# Patient Record
Sex: Male | Born: 1965
Health system: Southern US, Community
[De-identification: ages and names within clinical notes are randomized; demographics above are authoritative.]

## PROBLEM LIST (undated history)

## (undated) DIAGNOSIS — N189 Chronic kidney disease, unspecified: Secondary | ICD-10-CM

---

## 2023-07-06 DIAGNOSIS — N1832 Chronic kidney disease, stage 3b: Secondary | ICD-10-CM | POA: Insufficient documentation

## 2023-07-06 DIAGNOSIS — R410 Disorientation, unspecified: Principal | ICD-10-CM

## 2023-07-06 DIAGNOSIS — N179 Acute kidney failure, unspecified: Secondary | ICD-10-CM | POA: Insufficient documentation

## 2023-07-06 DIAGNOSIS — R030 Elevated blood-pressure reading, without diagnosis of hypertension: Secondary | ICD-10-CM | POA: Insufficient documentation

## 2023-07-06 DIAGNOSIS — G9340 Encephalopathy, unspecified: Secondary | ICD-10-CM

## 2023-07-06 DIAGNOSIS — R451 Restlessness and agitation: Secondary | ICD-10-CM | POA: Insufficient documentation

## 2023-07-06 DIAGNOSIS — F1910 Other psychoactive substance abuse, uncomplicated: Secondary | ICD-10-CM | POA: Insufficient documentation

## 2023-07-06 NOTE — ED Notes (Addendum)
 Pt alert and roaming halls threatening to leave. Pt in distress expressing that he can see his deceased family members and proceeds to follow them down the hallway. Unable to be redirected by RN and NT. MD able to redirect him to his assigned room. Pt requested android charger. NT searching for one but hasn't been able to find one yet.

## 2023-07-06 NOTE — ED Notes (Signed)
 MD notified of patient's change in status since discharge earlier today

## 2023-07-06 NOTE — ED Triage Notes (Addendum)
 Pt BIB mother with c/o AMS that started today. Pt seen earlier today for CP. Pt very lethargic in triage. Pt arousable to verbal stimuli at times. Pt denies taking any medication. Pt c/o CP. Pt does have dried blood in his left ear.

## 2023-07-06 NOTE — ED Notes (Signed)
 Patient endorses visual hallucinations

## 2023-07-06 NOTE — ED Notes (Signed)
RN attempted IV insertion x2.  

## 2023-07-07 DIAGNOSIS — N179 Acute kidney failure, unspecified: Secondary | ICD-10-CM | POA: Insufficient documentation

## 2023-07-07 DIAGNOSIS — G9340 Encephalopathy, unspecified: Secondary | ICD-10-CM

## 2023-07-07 DIAGNOSIS — F1910 Other psychoactive substance abuse, uncomplicated: Secondary | ICD-10-CM | POA: Insufficient documentation

## 2023-07-07 MED ORDER — HYDROXYZINE HCL 25 MG PO TABS
25.0000 mg | ORAL_TABLET | Freq: Three times a day (TID) | ORAL | Status: DC | PRN
Start: 2023-07-07 — End: 2023-07-08

## 2023-07-07 NOTE — Progress Notes (Signed)
 Pt laying in bed, crying and shouting. Checked on pt and he says he has a headache. Asked if pt wanted tylenol  or ibuprofen. Pt states "why would you ask anyone is they want tylenol ? I didn't even ask for your help." Informed pt that he was screaming so had to check up on pt. Pt refused tylenol  and states "I don't take tylenol  at home, I've had liver failure twice." Asked pt what he does at home to help with headaches. Pt states " I don't get headaches like this at home. It's because I'm here in the hospital. I don't need your help" Informed pt this RN will step out if pt did not need assist at this time. Sitter still at bedside.

## 2023-07-07 NOTE — ED Notes (Signed)
Pt refusing cardiac monitoring at this time.

## 2023-07-07 NOTE — ED Notes (Signed)
 IVC:  ENVELOPE: [id number] Case # [id number]  3 COPIES PLACED ON CLIPBOARD 1 COPY PLACED IN MEDICAL RECORDS ORIGINAL IN RED FOLDER

## 2023-07-07 NOTE — ED Notes (Addendum)
 Hospital police called as pt stated he wanted to get out of room and walk. Informed pt he can walk in his room. Pt started raising voice and stated he was not going to walk in his room and wanted to go outside. Informed pt that he was IVC'd under MD and he would not be able to do that. Pt stated he will then just take off all tele cords and walk himself. Informed pt hospital police will be called. Pt starting to take off all tele cords during hospital police arrival. Pt's belongings taken from pt and educated on reasoning for holding belongings during IVC process.

## 2023-07-07 NOTE — ED Notes (Signed)
 IVC'd 07/06/23, exp 07/13/23; IVC docs taken to [facility]

## 2023-07-07 NOTE — Progress Notes (Signed)
 Per MD at bedside, ok for IVC pt to have cellphone with patient.

## 2023-07-07 NOTE — Progress Notes (Signed)
 Hospital police at bedside.

## 2023-07-07 NOTE — Progress Notes (Signed)
 MD at bedside.

## 2023-07-07 NOTE — Progress Notes (Signed)
 Pt calm and cooperative upon admission to floor. Sitter at bedside. VSS on RA.  Placed on telemetry per order.  Lethargic and falling over while on EOB. UTA admission questions.  Per report, jewelry and phone are locked with hospital police.  Other personal belongings including backpack, clothing, keys, and charger box are in the cabinet outside of his room.

## 2023-07-07 NOTE — H&P (Signed)
 History and Physical    Francis Brady. [id number] DOB: 30-Mar-1965 DOA: 07/06/2023  Patient coming from: Home.  Chief Complaint: Confusion.  HPI: Francis Brady. is a 58 y.o. male with history of polysubstance abuse, chronic kidney disease stage III baseline creatinine around 2 was brought to the ER after patient was found to be increasingly confused.  Patient had come to the ER earlier for chest pain and was observed and discharged home.  Later patient was found to be confused and was brought to the ER.  Patient is not able to provide much history because of his confusion.  I tried to reach patient's mother on the contact list but I was unable to reach.  ED Course: In the ER patient was getting agitated was given Haldol  and Ativan  orally following which patient became  more calm.  CT head is unremarkable.  Labs show mildly worsening creatinine from baseline of around 2 it is around 2.5 now.  Potassium is 5.3.  Ammonia is 31.  Blood glucose was 68 but improved to 95.  Acetaminophen  level and salicylate levels were negative.  Patient admitted for acute encephalopathy because not clear but suspect could be polysubstance.  Review of Systems: As per HPI, rest all negative.   Past Medical History:  Diagnosis Date   Chronic kidney disease     History reviewed. No pertinent surgical history.   reports that he has quit smoking. His smoking use included cigarettes. He has never used smokeless tobacco. He reports that he does not currently use alcohol. No history on file for drug use.  Allergies  Allergen Reactions   Nsaids Anaphylaxis   Acetaminophen      Other Reaction(s): liver damage   Aspirin     Other Reaction(s): hemolytic anemia   Ciprofloxacin     Other Reaction(s): Unknown   Duloxetine     Other Reaction(s): mood changes/suicidal ideas   Eszopiclone     Other Reaction(s): CNS problems   Influenza Vaccines     Other Reaction(s): WBC bottomed out   Latex Dermatitis    Morphine     Other Reaction(s): CNS problems   Pneumococcal Vaccines     Other Reaction(s): WBC bottomed out   Pregabalin Other (See Comments)   Prochlorperazine Edisylate Nausea And Vomiting   Warfarin     Other Reaction(s): Unknown    Family History  Family history unknown: Yes    Prior to Admission medications   Medication Sig Start Date End Date Taking? Authorizing Provider  famotidine (PEPCID) 20 MG tablet Take 20 mg by mouth daily. 07/06/23   [provider]  gabapentin (NEURONTIN) 800 MG tablet Take 800 mg by mouth 4 (four) times daily. 04/04/23   [provider]  methadone (DOLOPHINE) 1 MG/1ML solution Take 5 mLs by mouth every 12 (twelve) hours.    [provider]  Multiple Vitamin (MULTI VITAMIN) TABS Take 1 tablet by mouth daily.    [provider]  ondansetron (ZOFRAN) 8 MG tablet Take 8 mg by mouth. 06/30/23   [provider]    Physical Exam: Constitutional: Moderately built and nourished. Vitals:   07/06/23 2215 07/06/23 2230 07/07/23 0009 07/07/23 0300  BP: (!) 169/142 (!) 128/100 131/74 102/69  Pulse: (!) 123 (!) 130 91 80  Resp: 20 19 18 14   Temp:   98.2 F (36.8 C)   TempSrc:      SpO2: 97% 98% 96% (!) 87%   Eyes: Anicteric no pallor. ENMT: No discharge  from the ears eyes nose and mouth. Neck: No mass felt.  No neck rigidity. Respiratory: No rhonchi or crepitations. Cardiovascular: S1-S2 heard. Abdomen: Soft nontender bowel sound present. Musculoskeletal: No edema. Skin: No rash. Neurologic: Patient appears confused able to redirect sometimes.  Moving all extremities. Psychiatric: Confused.   Labs on Admission: I have personally reviewed following labs and imaging studies  CBC: Recent Labs  Lab 07/06/23 1800 07/06/23 1822  WBC 12.1*  --   NEUTROABS 6.1  --   HGB 13.5 15.3  15.6  HCT 40.7 45.0  46.0  MCV 101.5*  --   PLT 305  --    Basic Metabolic Panel: Recent Labs  Lab 07/06/23 1800  07/06/23 1822 07/07/23 0020  NA 142 143  143 141  K 6.4* 5.5*  5.6* 5.3*  CL 105 104 105  CO2 26  --  25  GLUCOSE 68* 70 95  BUN 32* 39* 37*  CREATININE 2.32* 2.40* 2.57*  CALCIUM 9.7  --  9.6   GFR: CrCl cannot be calculated (Unknown ideal weight.). Liver Function Tests: Recent Labs  Lab 07/06/23 1800  AST 37  ALT 37  ALKPHOS 75  BILITOT 1.6*  PROT 7.8  ALBUMIN 3.8   No results for input(s): "LIPASE", "AMYLASE" in the last 168 hours. Recent Labs  Lab 07/07/23 0012  AMMONIA 31   Coagulation Profile: No results for input(s): "INR", "PROTIME" in the last 168 hours. Cardiac Enzymes: No results for input(s): "CKTOTAL", "CKMB", "CKMBINDEX", "TROPONINI" in the last 168 hours. BNP (last 3 results) No results for input(s): "PROBNP" in the last 8760 hours. HbA1C: No results for input(s): "HGBA1C" in the last 72 hours. CBG: Recent Labs  Lab 07/06/23 1554  GLUCAP 120*   Lipid Profile: No results for input(s): "CHOL", "HDL", "LDLCALC", "TRIG", "CHOLHDL", "LDLDIRECT" in the last 72 hours. Thyroid Function Tests: Recent Labs    07/07/23 0020  TSH 1.108   Anemia Panel: No results for input(s): "VITAMINB12", "FOLATE", "FERRITIN", "TIBC", "IRON", "RETICCTPCT" in the last 72 hours. Urine analysis: No results found for: "COLORURINE", "APPEARANCEUR", "LABSPEC", "PHURINE", "GLUCOSEU", "HGBUR", "BILIRUBINUR", "KETONESUR", "PROTEINUR", "UROBILINOGEN", "NITRITE", "LEUKOCYTESUR" Sepsis Labs: @LABRCNTIP (procalcitonin:4,lacticidven:4) )No results found for this or any previous visit (from the past 240 hours).   Radiological Exams on Admission: CT Head Wo Contrast Result Date: 07/06/2023 CLINICAL DATA:  Mental status change EXAM: CT HEAD WITHOUT CONTRAST TECHNIQUE: Contiguous axial images were obtained from the base of the skull through the vertex without intravenous contrast. RADIATION DOSE REDUCTION: This exam was performed according to the departmental dose-optimization  program which includes automated exposure control, adjustment of the mA and/or kV according to patient size and/or use of iterative reconstruction technique. COMPARISON:  CT brain 08/03/2022 FINDINGS: Brain: No acute territorial infarction, gross hemorrhage or intracranial mass. Minimal chronic small vessel ischemic changes of the white matter. Stable ventricle size. Vascular: Generalized hyperdensity within the venous sinuses and intracranial vessels presumably related to previous contrast administration. Skull: No fracture Sinuses/Orbits: Advanced chronic sinus disease of the maxillary sinuses Other: None IMPRESSION: 1. No CT evidence for acute intracranial abnormality allowing for limitation of previously administered intravenous contrast 2. Minimal chronic small vessel ischemic changes of the white matter. Electronically Signed   By: Esmeralda Hedge M.D.   On: 07/06/2023 17:51    EKG: Independently reviewed.  Sinus tachycardia.  Assessment/Plan Principal Problem:   Acute encephalopathy Active Problems:   ARF (acute renal failure) (HCC)   Polysubstance abuse (HCC)    Acute encephalopathy patient is afebrile.  Given the prior history of polysubstance abuse suspect patient may have taken some medications/drugs.  Will gently hydrate and closely observe.  I was unable to reach patient's family to get further history.  I did order 1 dose of thiamine .  Urine drug screen is pending.  Positive follow CBGs. Acute on chronic kidney disease stage III with hyperkalemia -   will keep patient on gentle hydration.  One dose of Lokelma  was ordered.  Repeat metabolic panel in few hours. Macrocytosis check B12 and folate levels.  Since patient has acute encephalopathy will need close monitoring further workup and management so will need more than 2 midnight stay.   DVT prophylaxis: Lovenox. Code Status: Full code. Family Communication: Unable to reach family. Disposition Plan: Medical floor. Consults called:  Child psychotherapist. Admission status: Observation.

## 2023-07-07 NOTE — ED Notes (Addendum)
 CCMD called for cardiac monitoring.

## 2023-07-07 NOTE — Progress Notes (Signed)
  INTERVAL PROGRESS NOTE    Francis Brady.- 58 y.o. male  LOS: 0 __________________________________________________________________  SUBJECTIVE: Admitted 07/06/2023 with cc of  Chief Complaint  Patient presents with   Chest Pain   Altered Mental Status   Since admission, agitated episode. Stated that he had chest pain  OBJECTIVE: Blood pressure 122/84, pulse 100, temperature 97.6 F (36.4 C), temperature source Oral, resp. rate 19, SpO2 91%.  General: agitated Cardiac: RRR, normal heart sounds, no murmurs.  Respiratory: CTAB, normal effort, No wheezes, rales or rhonchi Extremities: no edema. WWP. Skin: warm and dry, no rashes noted Neuro: alert and oriented, no focal deficits Psych: agitated, delusions, pressured speech    ASSESSMENT/PLAN:  I have reviewed the full H&P by Dr. [facility] Lavender, and I reviewed pertinent findings. In addition:  Complained of chest pain- troponins negative. Unable to conduct further investigation due to patient refusal. ECG on admission without ischemia.   Anxiety, PTSD, agitation- he was put on IVC overnight and became aware of it this am and became very agitated when his belongings were taken and police arrived to room which further exacerbated his agitation.  He refused all interventions.  After prolonged conversation, he was agreeable to sitting so I could talk with him and do a focused exam. He agreed to bloodwork and would remain calm and stay if he could have his phone to call his wife.  Urine sample was also given and positive for polysubstance.  He stated repeatedly that he would refuse any medications.  Did agree to take a dose of lokelma  for hyperkalemia. Vitals remain stable.  - psychiatry consulted as patient exhibited delusions as well as pressured speech. We were able to have minimal amount of productive conversation after extended time reassuring him and he stated he does not take any medications at home and does not want to take  any anxiety medications currently. He will perform meditation. He briefly mentioned several family issues that have occurred in recent past that are gravely impacting his mental health and life.    Principal Problem:   Acute encephalopathy Active Problems:   ARF (acute renal failure) (HCC)   Polysubstance abuse (HCC)      Ree Candy, DO [facility] 07/07/2023, 3:37 PM    [email or web address] Available by Epic secure chat 7AM-7PM. If 7PM-7AM, please contact night-coverage

## 2023-07-07 NOTE — Progress Notes (Signed)
 Attempted x2 to call mother. Unable to reach at this time

## 2023-07-07 NOTE — ED Notes (Signed)
 PT refusing to wear cardiac leads or to be placed on cardiac monitor

## 2023-07-07 NOTE — Progress Notes (Signed)
 Pt getting agitated again. Started raising voice and yelling at this RN and sitter stating he is being held hostage. Educated pt again that he is IVC'd and cannot leave d/t AMS. Pt started shouting and states we "took all his stuff." Educated pt again that the MD spoke with pt earlier and pt agreed with plan. Security called for back up assist

## 2023-07-08 ENCOUNTER — Other Ambulatory Visit (HOSPITAL_COMMUNITY): Payer: Self-pay

## 2023-07-08 DIAGNOSIS — R451 Restlessness and agitation: Secondary | ICD-10-CM | POA: Insufficient documentation

## 2023-07-08 DIAGNOSIS — N1832 Chronic kidney disease, stage 3b: Secondary | ICD-10-CM | POA: Insufficient documentation

## 2023-07-08 DIAGNOSIS — R030 Elevated blood-pressure reading, without diagnosis of hypertension: Secondary | ICD-10-CM | POA: Insufficient documentation

## 2023-07-08 LAB — CBC
HCT: 43.9 % (ref 39.0–52.0)
Hemoglobin: 14.3 g/dL (ref 13.0–17.0)
MCH: 32.6 pg (ref 26.0–34.0)
MCHC: 32.6 g/dL (ref 30.0–36.0)
MCV: 100 fL (ref 80.0–100.0)
Platelets: 369 10*3/uL (ref 150–400)
RBC: 4.39 MIL/uL (ref 4.22–5.81)
RDW: 13.2 % (ref 11.5–15.5)
WBC: 10.6 10*3/uL — ABNORMAL HIGH (ref 4.0–10.5)
nRBC: 0 % (ref 0.0–0.2)

## 2023-07-08 LAB — BASIC METABOLIC PANEL WITH GFR
Anion gap: 14 (ref 5–15)
BUN: 40 mg/dL — ABNORMAL HIGH (ref 6–20)
CO2: 24 mmol/L (ref 22–32)
Calcium: 9.6 mg/dL (ref 8.9–10.3)
Chloride: 103 mmol/L (ref 98–111)
Creatinine, Ser: 1.8 mg/dL — ABNORMAL HIGH (ref 0.61–1.24)
GFR, Estimated: 43 mL/min — ABNORMAL LOW (ref >60)
Glucose, Bld: 101 mg/dL — ABNORMAL HIGH (ref 70–99)
Potassium: 4.2 mmol/L (ref 3.5–5.1)
Sodium: 141 mmol/L (ref 135–145)

## 2023-07-08 LAB — GLUCOSE, CAPILLARY
Glucose-Capillary: 101 mg/dL — ABNORMAL HIGH (ref 70–99)
Glucose-Capillary: 117 mg/dL — ABNORMAL HIGH (ref 70–99)
Glucose-Capillary: 87 mg/dL (ref 70–99)
Glucose-Capillary: 95 mg/dL (ref 70–99)

## 2023-07-08 NOTE — Plan of Care (Signed)
  Problem: Clinical Measurements: Goal: Ability to maintain clinical measurements within normal limits will improve Outcome: Progressing   Problem: Clinical Measurements: Goal: Will remain free from infection Outcome: Progressing   Problem: Clinical Measurements: Goal: Diagnostic test results will improve Outcome: Progressing   Problem: Activity: Goal: Risk for activity intolerance will decrease Outcome: Progressing   Problem: Nutrition: Goal: Adequate nutrition will be maintained Outcome: Progressing   Problem: Pain Managment: Goal: General experience of comfort will improve and/or be controlled Outcome: Progressing

## 2023-07-08 NOTE — Plan of Care (Signed)

## 2023-07-08 NOTE — Progress Notes (Signed)
 PROGRESS NOTE  Francis Brady. [id number] DOB: 18-Oct-1965   PCP: Lorella Roles, MD  Patient is from: Home  DOA: 07/06/2023 LOS: 0  Chief complaints Chief Complaint  Patient presents with   Chest Pain   Altered Mental Status     Brief Narrative / Interim history: 58 year old M with history of polysubstance use on methadone and CKD-3B brought back to ED due to confusion.   He was initially seen in ED on 5/15 for chest pain and shortness of breath.  Workup including basic labs, serial troponin and CXR were unrevealing.  CT angio chest without acute finding but some signs of esophagitis and atelectasis.  EKG sinus tachycardia to 150 with RBBB (chronic).  Patient was discharged from ED but brought back that afternoon.  In ED, stable vitals.  K6.4. Cr 2.32 (baseline 2.0)..  BUN 32.  Glucose 68.  T. bili 1.6.  WBC 12.1.  VBG without significant finding.  Tylenol  and salicylate level negative.  CT head without acute finding.  UDS, thyroid panel and UA ordered.  Patient was admitted for acute encephalopathy and AKI on CKD-3 with hyperkalemia.  The next day, UDS positive for opiates, benzodiazepines and amphetamine.  He was agitated requiring IVC, IV Haldol  and psychiatry consultation.   Subjective: Seen and examined earlier this morning.  No major events overnight of this morning.  No complaints.  Eager to go home.  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  He denies using amphetamine.  He reports going to methadone clinic for methadone.  Denies chest pain, shortness of breath, GI, focal neuro or UTI symptoms.  Denies audiovisual hallucination.  Objective: Vitals:   07/08/23 0021 07/08/23 0213 07/08/23 0501 07/08/23 0746  BP: (!) 162/111 (!) 144/83 (!) 158/112 (!) 172/89  Pulse: 96 99 (!) 101 100  Resp: 18 20 18 18   Temp: 98.1 F (36.7 C) 98 F (36.7 C) 98 F (36.7 C) 98.1 F (36.7 C)  TempSrc: Oral Oral Oral Oral  SpO2: 91% 95% 95% 93%    Examination:  GENERAL: No  apparent distress.  Nontoxic. HEENT: MMM.  Vision and hearing grossly intact.  NECK: Supple.  No apparent JVD.  RESP:  No IWOB.  Fair aeration bilaterally. CVS:  RRR. Heart sounds normal.  ABD/GI/GU: BS+. Abd soft, NTND.  MSK/EXT:  Moves extremities. No apparent deformity. No edema.  SKIN: no apparent skin lesion or wound NEURO: Awake, alert and oriented appropriately.  No apparent focal neuro deficit. PSYCH: Calm. Normal affect.   Consultants:  Psychiatry  Procedures: None  Microbiology summarized: None  Assessment and plan: Acute metabolic encephalopathy/agitation-likely substance-induced.  History of polysubstance use.  UDS positive for opiate, benzo and amphetamine.  Patient denies using amphetamine.  He was involuntary committed due to agitation and encephalopathy that seems to have resolved.  Patient denies psychotic symptoms.  He is eager to go home. -Medically stable for discharge if cleared by psychiatry. -Continue one-to-one safety sitter while under IVC -TOC consulted for polysubstance use  Elevated blood pressure: BP elevated this morning.  BP was soft yesterday.  Likely withdrawal from substance - IV labetalol as needed -Resume home methadone. - May consider clonidine  AKI on CKD-3B: b/l Cr ~2.0.  AKI resolved.  Chest pain and shortness of breath: Likely from substance use.  Serial troponin negative.  EKG with sinus tachycardia to 150 and chronic RBBB.  CT angio chest negative for PE but possible esophagitis and atelectasis.  Polysubstance use: Patient denies despite positive  UDS. - Consult TOC  Hyperkalemia: Resolved  GERD/esophagitis -PPI  There is no height or weight on file to calculate BMI.          DVT prophylaxis:  heparin  injection 5,000 Units Start: 07/07/23 1400  Code Status: Full code Family Communication: None at bedside Level of care: Telemetry Medical Status is: Observation The patient remains OBS appropriate and will d/c before 2  midnights.   Final disposition: To be determined after psych evaluation   55 minutes with more than 50% spent in reviewing records, counseling patient/family and coordinating care.   Sch Meds:  Scheduled Meds:  heparin   5,000 Units Subcutaneous Q8H   Continuous Infusions: PRN Meds:.acetaminophen , haloperidol  **OR** haloperidol  lactate, hydrALAZINE, hydrOXYzine   Antimicrobials: Anti-infectives (From admission, onward)    None        I have personally reviewed the following labs and images: CBC: Recent Labs  Lab 07/06/23 1800 07/06/23 1822 07/07/23 0507 07/08/23 0329  WBC 12.1*  --  9.5 10.6*  NEUTROABS 6.1  --   --   --   HGB 13.5 15.3  15.6 14.3 14.3  HCT 40.7 45.0  46.0 44.6 43.9  MCV 101.5*  --  104.4* 100.0  PLT 305  --  352 369   BMP &GFR Recent Labs  Lab 07/06/23 1800 07/06/23 1822 07/07/23 0020 07/07/23 0507 07/08/23 0329  NA 142 143  143 141 145 141  K 6.4* 5.5*  5.6* 5.3* 5.7* 4.2  CL 105 104 105 110 103  CO2 26  --  25 21* 24  GLUCOSE 68* 70 95 89 101*  BUN 32* 39* 37* 41* 40*  CREATININE 2.32* 2.40* 2.57* 2.61* 1.80*  CALCIUM 9.7  --  9.6 9.7 9.6   CrCl cannot be calculated (Unknown ideal weight.). Liver & Pancreas: Recent Labs  Lab 07/06/23 1800 07/07/23 0507  AST 37 32  ALT 37 33  ALKPHOS 75 75  BILITOT 1.6* 0.8  PROT 7.8 7.8  ALBUMIN 3.8 3.9   No results for input(s): "LIPASE", "AMYLASE" in the last 168 hours. Recent Labs  Lab 07/07/23 0012  AMMONIA 31   Diabetic: No results for input(s): "HGBA1C" in the last 72 hours. Recent Labs  Lab 07/07/23 2042 07/08/23 0017 07/08/23 0458 07/08/23 0924 07/08/23 1140  GLUCAP 89 87 95 101* 117*   Cardiac Enzymes: No results for input(s): "CKTOTAL", "CKMB", "CKMBINDEX", "TROPONINI" in the last 168 hours. No results for input(s): "PROBNP" in the last 8760 hours. Coagulation Profile: No results for input(s): "INR", "PROTIME" in the last 168 hours. Thyroid Function  Tests: Recent Labs    07/07/23 0020  TSH 1.108   Lipid Profile: No results for input(s): "CHOL", "HDL", "LDLCALC", "TRIG", "CHOLHDL", "LDLDIRECT" in the last 72 hours. Anemia Panel: Recent Labs    07/07/23 0020 07/07/23 0507  VITAMINB12 400  --   FOLATE  --  10.0   Urine analysis: No results found for: "COLORURINE", "APPEARANCEUR", "LABSPEC", "PHURINE", "GLUCOSEU", "HGBUR", "BILIRUBINUR", "KETONESUR", "PROTEINUR", "UROBILINOGEN", "NITRITE", "LEUKOCYTESUR" Sepsis Labs: Invalid input(s): "PROCALCITONIN", "LACTICIDVEN"  Microbiology: No results found for this or any previous visit (from the past 240 hours).  Radiology Studies: No results found.    Laurelyn Terrero T. Ahniyah Giancola [facility]  If 7PM-7AM, please contact night-coverage [email or web address] 07/08/2023, 12:15 PM

## 2023-07-08 NOTE — Discharge Summary (Signed)
 Physician Discharge Summary  Arlyne Bering. ZOX:[id number] DOB: 1965/10/10 DOA: 07/06/2023  PCP: Lorella Roles, MD  Admit date: 07/06/2023 Discharge date: 07/08/23  Admitted From: Home Disposition: Home Recommendations for Outpatient Follow-up:  Follow up with PCP in 1 week Reassess blood pressure, CMP and CBC at follow-up Outpatient follow-up with psychiatry Please follow up on the following pending results: None  Home Health: No need identified Equipment/Devices: No need identified  Discharge Condition: Stable CODE STATUS: Full code  Follow-up Information     Paliwal, Himanshu, MD. Schedule an appointment as soon as possible for a visit in 1 week(s).   Specialty: Family Medicine Contact information: [address] [place] [address] [phone]                 Hospital course 58 year old M with history of polysubstance use on methadone and CKD-3B brought back to ED due to confusion.   He was initially seen in ED on 5/15 for chest pain and shortness of breath.  Workup including basic labs, serial troponin and CXR were unrevealing.  CT angio chest without acute finding but some signs of esophagitis and atelectasis.  EKG sinus tachycardia to 150 with RBBB (chronic).  Patient was discharged from ED but brought back that afternoon.   In ED, stable vitals.  K6.4. Cr 2.32 (baseline 2.0)..  BUN 32.  Glucose 68.  T. bili 1.6.  WBC 12.1.  VBG without significant finding.  Tylenol  and salicylate level negative.  CT head without acute finding.  UDS, thyroid panel and UA ordered.  Patient was admitted for acute encephalopathy and AKI on CKD-3 with hyperkalemia.   The next day, UDS positive for opiates, benzodiazepines and amphetamine. He was agitated requiring IVC, IV Haldol  and psychiatry consulted.  On the day of discharge, agitation resolved.  Patient is calm and cooperative.  No psychosis.  Fully oriented.  Evaluated by psychiatry and cleared for discharge from  psychiatry standpoint.  IVC rescinded.  He was started on prazosin 1 mg at bedtime for nightmares as recommended by psychiatry.   Discussed about urine drug screen result with patient.  He denies using amphetamine.  Reports going to [facility] and getting methadone 200 mg daily.  [facility] is closed on the weekends.  We were not able to confirm this.   He has been encouraged to avoid medications or drugs that are not prescribed to him.  Encouraged to follow-up with psychiatry outpatient.  Provided with resources.  Patient's blood pressure was slightly elevated likely due to withdrawal.  Has no history of hypertension.  His blood pressure was soft the day prior.  Patient was given methadone 20 mg before discharge.  He was also started on prazosin which could help with blood pressure as well.  Reassess blood pressure at follow-up.   Acute kidney failure resolved.  See individual problem list below for more.   Problems addressed during this hospitalization Acute metabolic encephalopathy/agitation-likely substance-induced.  History of polysubstance use.  UDS positive for opiate, benzo and amphetamine.  Patient denies using amphetamine.  He was involuntary committed due to agitation and encephalopathy that seems to have resolved.  Patient denies psychotic symptoms.  He is eager to go home.  Cleared by psychiatry.  IVC rescinded.  Started on low-dose prazosin for nightmares -Outpatient follow-up with psychiatry -Advised to avoid medications or drugs that are not prescribed to him. - Provided with resources.   Elevated blood pressure: BP elevated this morning.  BP was soft yesterday.  Likely  withdrawal from substance such as opiate -Received p.o. methadone 20 mg -Also started on low-dose prazosin for nightmares which could also help his blood pressure -Reassess BP at follow-up.   AKI on CKD-3B: b/l Cr ~2.0.  AKI resolved.   Chest pain and shortness of breath: Likely from  substance use.  Serial troponin negative.  EKG with sinus tachycardia to 150 and chronic RBBB.  CT angio chest negative for PE but possible esophagitis and atelectasis. -Protonix for esophagitis.   Polysubstance use: Patient denies despite positive UDS.  Reports going to [facility] for methadone.  Reports taking 200 mg daily.  Not able to confirm this over the weekend. -Received methadone 20 mg once. -Advised to follow-up with [facility]   Hyperkalemia: Resolved   GERD/esophagitis -PPI            Time spent 35 minutes  Vital signs Vitals:   07/08/23 0213 07/08/23 0501 07/08/23 0746 07/08/23 1200  BP: (!) 144/83 (!) 158/112 (!) 172/89 (!) 154/111  Pulse: 99 (!) 101 100   Temp: 98 F (36.7 C) 98 F (36.7 C) 98.1 F (36.7 C) 98.4 F (36.9 C)  Resp: 20 18 18    SpO2: 95% 95% 93% 97%  TempSrc: Oral Oral Oral Oral     Discharge exam  GENERAL: No apparent distress.  Nontoxic. HEENT: MMM.  Vision and hearing grossly intact.  NECK: Supple.  No apparent JVD.  RESP:  No IWOB.  Fair aeration bilaterally. CVS:  RRR. Heart sounds normal.  ABD/GI/GU: BS+. Abd soft, NTND.  MSK/EXT:  Moves extremities. No apparent deformity. No edema.  SKIN: no apparent skin lesion or wound NEURO: Awake, alert and oriented x 4.  No apparent focal neuro deficit. PSYCH: Calm. Normal affect.  Denies hallucination, paranoia Discharge Instructions Discharge Instructions     Diet - low sodium heart healthy   Complete by: As directed    Discharge instructions   Complete by: As directed    It has been a pleasure taking care of you!  You were hospitalized due to confusion and agitation.  Your urine drug screen is positive for amphetamine, opiate and benzodiazepines which could cause mental status change including confusion, sedation and agitation. Amphetamine can also cause chest pain.  We strongly recommend not taking medications other than those prescribed to you.  Follow-up with your primary  care doctor and psychiatrist as soon as possible.   Take care,   Increase activity slowly   Complete by: As directed    No wound care   Complete by: As directed       Allergies as of 07/08/2023       Reactions   Nsaids Anaphylaxis   Acetaminophen     Other Reaction(s): liver damage   Aspirin    Other Reaction(s): hemolytic anemia   Ciprofloxacin    Other Reaction(s): Unknown   Duloxetine    Other Reaction(s): mood changes/suicidal ideas   Eszopiclone    Other Reaction(s): CNS problems   Influenza Vaccines    Other Reaction(s): WBC bottomed out   Latex Dermatitis   Morphine    Other Reaction(s): CNS problems   Pneumococcal Vaccines    Other Reaction(s): WBC bottomed out   Pregabalin Other (See Comments)   Prochlorperazine Edisylate Nausea And Vomiting   Warfarin    Other Reaction(s): Unknown        Medication List     TAKE these medications    famotidine 20 MG tablet Commonly known as: PEPCID Take 20 mg by mouth  daily.   gabapentin 800 MG tablet Commonly known as: NEURONTIN Take 800 mg by mouth 4 (four) times daily.   methadone 1 MG/1ML solution Commonly known as: DOLOPHINE Take 5 mLs by mouth every 12 (twelve) hours.   Multi Vitamin Tabs Take 1 tablet by mouth daily.   ondansetron 8 MG tablet Commonly known as: ZOFRAN Take 8 mg by mouth.   pantoprazole 40 MG tablet Commonly known as: PROTONIX Take 1 tablet (40 mg total) by mouth daily.   prazosin 1 MG capsule Commonly known as: MINIPRESS Take 1 capsule (1 mg total) by mouth at bedtime.        Consultations: Psychiatry  Procedures/Studies:   CT Head Wo Contrast Result Date: 07/06/2023 CLINICAL DATA:  Mental status change EXAM: CT HEAD WITHOUT CONTRAST TECHNIQUE: Contiguous axial images were obtained from the base of the skull through the vertex without intravenous contrast. RADIATION DOSE REDUCTION: This exam was performed according to the departmental dose-optimization program which  includes automated exposure control, adjustment of the mA and/or kV according to patient size and/or use of iterative reconstruction technique. COMPARISON:  CT brain 08/03/2022 FINDINGS: Brain: No acute territorial infarction, gross hemorrhage or intracranial mass. Minimal chronic small vessel ischemic changes of the white matter. Stable ventricle size. Vascular: Generalized hyperdensity within the venous sinuses and intracranial vessels presumably related to previous contrast administration. Skull: No fracture Sinuses/Orbits: Advanced chronic sinus disease of the maxillary sinuses Other: None IMPRESSION: 1. No CT evidence for acute intracranial abnormality allowing for limitation of previously administered intravenous contrast 2. Minimal chronic small vessel ischemic changes of the white matter. Electronically Signed   By: Esmeralda Hedge M.D.   On: 07/06/2023 17:51       The results of significant diagnostics from this hospitalization (including imaging, microbiology, ancillary and laboratory) are listed below for reference.     Microbiology: No results found for this or any previous visit (from the past 240 hours).   Labs:  CBC: Recent Labs  Lab 07/06/23 1800 07/06/23 1822 07/07/23 0507 07/08/23 0329  WBC 12.1*  --  9.5 10.6*  NEUTROABS 6.1  --   --   --   HGB 13.5 15.3  15.6 14.3 14.3  HCT 40.7 45.0  46.0 44.6 43.9  MCV 101.5*  --  104.4* 100.0  PLT 305  --  352 369   BMP &GFR Recent Labs  Lab 07/06/23 1800 07/06/23 1822 07/07/23 0020 07/07/23 0507 07/08/23 0329  NA 142 143  143 141 145 141  K 6.4* 5.5*  5.6* 5.3* 5.7* 4.2  CL 105 104 105 110 103  CO2 26  --  25 21* 24  GLUCOSE 68* 70 95 89 101*  BUN 32* 39* 37* 41* 40*  CREATININE 2.32* 2.40* 2.57* 2.61* 1.80*  CALCIUM 9.7  --  9.6 9.7 9.6   CrCl cannot be calculated (Unknown ideal weight.). Liver & Pancreas: Recent Labs  Lab 07/06/23 1800 07/07/23 0507  AST 37 32  ALT 37 33  ALKPHOS 75 75  BILITOT 1.6*  0.8  PROT 7.8 7.8  ALBUMIN 3.8 3.9   No results for input(s): "LIPASE", "AMYLASE" in the last 168 hours. Recent Labs  Lab 07/07/23 0012  AMMONIA 31   Diabetic: No results for input(s): "HGBA1C" in the last 72 hours. Recent Labs  Lab 07/07/23 2042 07/08/23 0017 07/08/23 0458 07/08/23 0924 07/08/23 1140  GLUCAP 89 87 95 101* 117*   Cardiac Enzymes: No results for input(s): "CKTOTAL", "CKMB", "CKMBINDEX", "TROPONINI" in the last  168 hours. No results for input(s): "PROBNP" in the last 8760 hours. Coagulation Profile: No results for input(s): "INR", "PROTIME" in the last 168 hours. Thyroid Function Tests: Recent Labs    07/07/23 0020  TSH 1.108   Lipid Profile: No results for input(s): "CHOL", "HDL", "LDLCALC", "TRIG", "CHOLHDL", "LDLDIRECT" in the last 72 hours. Anemia Panel: Recent Labs    07/07/23 0020 07/07/23 0507  VITAMINB12 400  --   FOLATE  --  10.0   Urine analysis: No results found for: "COLORURINE", "APPEARANCEUR", "LABSPEC", "PHURINE", "GLUCOSEU", "HGBUR", "BILIRUBINUR", "KETONESUR", "PROTEINUR", "UROBILINOGEN", "NITRITE", "LEUKOCYTESUR" Sepsis Labs: Invalid input(s): "PROCALCITONIN", "LACTICIDVEN"   SIGNED:  Theadore Finger, MD  [facility] 07/08/2023, 3:16 PM

## 2023-07-08 NOTE — Progress Notes (Signed)
 Patient's belongings from security were returned to patient. There was a bag with personal items in the cabinet outside of his room which I gave to patient. Patient and his mother stated that he was missing items such as his shoes, dentures, phone charger. Secretary called the ED and they stated that they di not have those items. I gave the mother [facility] phone number for her to call during business hours.  I reviewed d/c paperwork with patient and told him that he needed to wait for his medication from [facility] pharmacy. He left without getting his medications.

## 2023-07-08 NOTE — TOC Progression Note (Signed)
 Per MD, pt no longer meets IVC criteria. Change in Commitment form completed and sent to magistrate via eFile, case #[id number], envelope [id number].   Paullette Boston, MSW, LCSW [phone] (coverage)

## 2023-07-08 NOTE — Consult Note (Signed)
 [facility] Psychiatric Consult Initial  Patient Name: .Francis Brady.  MRN: [id number]  DOB: 08-07-1965  Consult Order details:  Orders (From admission, onward)     Start     Ordered   07/07/23 1539  IP CONSULT TO PSYCHIATRY       Ordering Provider: Ree Candy, MD  Provider:  (Not yet assigned)  Question Answer Comment  Location [facility]   Reason for Consult? agitated, delusions, medication recommendations, IVC      07/07/23 1539             Mode of Visit: In person    Psychiatry Consult Evaluation  Service Date: Jul 08, 2023 LOS:  LOS: 0 days  Chief Complaint "I don't feel paranoid or depressed, but I need medication for nightmares due to my PTSD"  Primary Psychiatric Diagnoses  Post traumatic stress disorder 2.  Opioid abuse 3.  Amphetamine abuse   Assessment  Francis Brady. is a 58 y.o. male admitted: Medicallyfor 07/06/2023  3:53 PM for chest pain. He carries the psychiatric diagnoses of PTSD, Opioid abuse and Amphetamine abuse and has a past medical history of chronic kidney disease state III. Psychiatry was consulted for "agitated, delusions, medication recommendations, IVC"  His current presentation of nightmares, night terror in the context of previous trauma  is most consistent with history of PTSD. However, patient denies flashbacks, intrusive thoughts, hyper arousal symptoms, derealization, depersonalization, depressive symptoms or anxiety. Patient has been getting agitated and irritable intermittently which may be due to withdrawal from methadone as patient reports taking Methadone 200 mg daily from [facility] which was never confirmed or started on this admission. He does not meet criteria for inpatient admission due to absent suicidal or homicidal ideation, intent or plan. Current outpatient medications include Gabapentin 800 mg daily and Methadone ? 200 mg daily, he denies any psychotropic medications. Please see plan below for  detailed recommendations.   Diagnoses:  Active Brady problems: Principal Problem:   Acute encephalopathy Active Problems:   ARF (acute renal failure) (HCC)   Polysubstance abuse (HCC)    Plan   ## Psychiatric Medication Recommendations:  -Please confirm Methadone dose from the program ([facility]) to avoid withdrawal -Add Prazosin 1 mg at bedtime for nightmares-Please optimize as needed.  -Consider low dose Methadone 20 mg daily before confirming the dose of Methadone from the program.  -Continue other medical treatment as the primary team   ## Medical Decision Making Capacity: Not specifically addressed in this encounter  ## Further Work-up:  --  EKG-Monitor EKG.  -- most recent EKG on 07/04/2023 had QtC of 513 -- Pertinent labwork reviewed earlier this admission includes: BUN-40, CR-1.8, GFR-43, WBC-10.6.    ## Disposition:-- There are no psychiatric contraindications to discharge at this time  ## Behavioral / Environmental: - No specific recommendations at this time.     ## Safety and Observation Level:  - Based on my clinical evaluation, I estimate the patient to be at low risk of self harm in the current setting. - At this time, we recommend  routine. This decision is based on my review of the chart including patient's history and current presentation, interview of the patient, mental status examination, and consideration of suicide risk including evaluating suicidal ideation, plan, intent, suicidal or self-harm behaviors, risk factors, and protective factors. This judgment is based on our ability to directly address suicide risk, implement suicide prevention strategies, and develop a safety plan while the patient is in  the clinical setting. Please contact our team if there is a concern that risk level has changed.  CSSR Risk Category:C-SSRS RISK CATEGORY: No Risk  Suicide Risk Assessment: Patient has following modifiable risk factors for suicide: recklessness, which  we are addressing by prescribing medication. Patient has following non-modifiable or demographic risk factors for suicide: male gender Patient has the following protective factors against suicide: Frustration tolerance  Thank you for this consult request. Recommendations have been communicated to the primary team.  We will sign off at this time. Thank you for this consult.   Windy Hatchet, MD       History of Present Illness  Relevant Aspects of [facility] Course:   Patient Report:  Patient seen face to face in his [facility] room. He is awake, alert, calm and cooperative, and oriented to time, place, person and situation. Patient states he came to the [facility] because he was experiencing chest pain and shortness of breath which he does not know the cause, but states he feels much better today. Patient reports history of PTSD caused by the death of his son and daughter who were murdered years ago. He reports ongoing nightmares and night terror, but denies flashbacks, intrusive thoughts, hyper arousal symptoms, derealization or depersonalization. Patient reports history of chronic pain for which he takes Gabapentin 800 mg three times daily and Methadone ?200 mg daily from [facility]. However, patient states he has not been getting his Methadone since he was in the [facility].   Patient denies depressed mood, lack of motivation, low energy level, anxiety, apprehensions, nervousness, irritability or sleep issues. He denies delusions, hallucinations, other perceptual abnormalities, suicidal or homicidal ideation, intent or plan. Of note, urine drug screen on this admission is positive for Opioid, Amphetamine, and BZD but patient denies use of any illicit substances.   Review of Systems  Psychiatric/Behavioral:  Positive for substance abuse. Negative for depression, hallucinations and suicidal ideas. The patient is not nervous/anxious and does not have insomnia.      Psychiatric and Social  History  Psychiatric History:  Information collected from patient  Prev Dx/Sx: PTSD Current Psych Provider: None  Home Meds (current): none  Previous Med Trials: Amitriptyline  Therapy: denies   Prior Psych Hospitalization: denies   Prior Self Harm: denies  Prior Violence: denies   Family Psych History: denies  Family Hx suicide: denies   Social History:  Educational Hx: refused to answer Occupational Hx: unemployed  Armed forces operational officer Hx: denies  Living Situation: Lives alone  Spiritual Hx: denies  Access to weapons/lethal means: Patient denies    Substance History Alcohol: social drinker Type of alcohol beer Last Drink months ago Number of drinks per day N/A History of alcohol withdrawal seizures N/A History of DT's N/A Tobacco: denies  Illicit drugs: Amphetamine, Opioid and BZD are positive in his urine but he denies.  Prescription drug abuse: Opioid. Rehab hx: denies   Exam Findings   Vital Signs:  Temp:  [97.8 F (36.6 C)-98.1 F (36.7 C)] 98.1 F (36.7 C) (05/17 0746) Pulse Rate:  [82-101] 100 (05/17 0746) Resp:  [16-21] 18 (05/17 0746) BP: (100-172)/(83-112) 172/89 (05/17 0746) SpO2:  [91 %-95 %] 93 % (05/17 0746) Blood pressure (!) 172/89, pulse 100, temperature 98.1 F (36.7 C), temperature source Oral, resp. rate 18, SpO2 93%. There is no height or weight on file to calculate BMI.  Physical Exam  Mental Status Exam: General Appearance: Casual  Orientation:  Full (Time, Place, and Person)  Memory:  Immediate;  Good  Concentration:  Concentration: Good and Attention Span: Good  Recall:  Good  Attention  Good  Eye Contact:  Good  Speech:  Normal Rate  Language:  Good  Volume:  Normal  Mood: "I feel good"  Affect:  Appropriate and Congruent  Thought Process:  Goal Directed and Linear  Thought Content:  Logical  Suicidal Thoughts:  No  Homicidal Thoughts:  No  Judgement:  Fair  Insight:  Fair  Psychomotor Activity:  Normal  Akathisia:  No  Fund of  Knowledge:  Good      Assets:  Communication Skills  Cognition:  WNL  ADL's:  Intact  AIMS (if indicated):        Other History   These have been pulled in through the EMR, reviewed, and updated if appropriate.  Family History:  The patient's Family history is unknown by patient.  Medical History: Past Medical History:  Diagnosis Date   Chronic kidney disease     Surgical History: History reviewed. No pertinent surgical history.   Medications:   Current Facility-Administered Medications:    acetaminophen  (TYLENOL ) tablet 650 mg, 650 mg, Oral, Q6H PRN, Ree Candy, MD   haloperidol  (HALDOL ) tablet 1 mg, 1 mg, Oral, Q8H PRN **OR** haloperidol  lactate (HALDOL ) injection 2 mg, 2 mg, Intramuscular, Q8H PRN, Evette Hoes L, MD, 2 mg at 07/07/23 1726   heparin  injection 5,000 Units, 5,000 Units, Subcutaneous, Q8H, Angelene Kelly, MD, 5,000 Units at 07/08/23 0537   hydrALAZINE (APRESOLINE) injection 5 mg, 5 mg, Intravenous, Q6H PRN, Chavez, Abigail, NP, 5 mg at 07/08/23 0153   hydrOXYzine  (ATARAX ) tablet 25 mg, 25 mg, Oral, TID PRN, Ree Candy, MD, 25 mg at 07/08/23 0215   labetalol (NORMODYNE) injection 10 mg, 10 mg, Intravenous, Q2H PRN, Gonfa, Taye T, MD  Allergies: Allergies  Allergen Reactions   Nsaids Anaphylaxis   Acetaminophen      Other Reaction(s): liver damage   Aspirin     Other Reaction(s): hemolytic anemia   Ciprofloxacin     Other Reaction(s): Unknown   Duloxetine     Other Reaction(s): mood changes/suicidal ideas   Eszopiclone     Other Reaction(s): CNS problems   Influenza Vaccines     Other Reaction(s): WBC bottomed out   Latex Dermatitis   Morphine     Other Reaction(s): CNS problems   Pneumococcal Vaccines     Other Reaction(s): WBC bottomed out   Pregabalin Other (See Comments)   Prochlorperazine Edisylate Nausea And Vomiting   Warfarin     Other Reaction(s): Unknown    Djibril Glogowski A Chiann Goffredo, MD

## 2023-07-08 NOTE — Care Management Obs Status (Signed)
 MEDICARE OBSERVATION STATUS NOTIFICATION   Patient Details  Name: Francis [facility]. MRN: [id number] Date of Birth: 02-14-1966   Medicare Observation Status Notification Given:  Yes    Cindia Crease, RN 07/08/2023, 8:36 AM

## 2023-07-08 NOTE — Progress Notes (Addendum)
 Patient B/P ( 162/111)  HR 96 notified on call provider new order for PRN hydralazine.
